# Patient Record
Sex: Female | Born: 1985 | Race: White | Hispanic: No | Marital: Married | Smoking: Never smoker
Health system: Southern US, Community
[De-identification: ages and names within clinical notes are randomized; demographics above are authoritative.]

---

## 2014-12-01 ENCOUNTER — Emergency Department: Payer: Self-pay | Admitting: Emergency Medicine

## 2016-09-15 IMAGING — US US OB LIMITED
1 series · 14 of 28 positions shown · non-contrast
Comparison: none

CLINICAL DATA: Vaginal bleeding.  Pelvic pain.  Unsure of LMP.

EXAM:
LIMITED OBSTETRIC ULTRASOUND

[Series 1: us ob limited · 0.22mm/px · 14 of 66 slices shown]
[im 3/66]
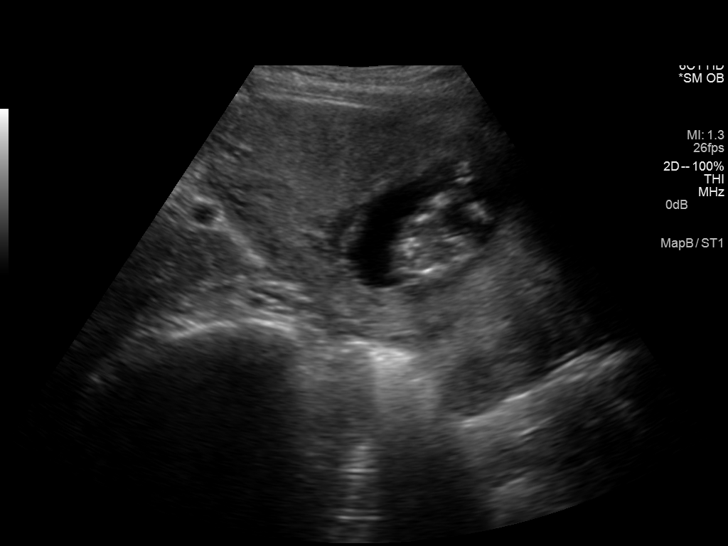
[im 8/66]
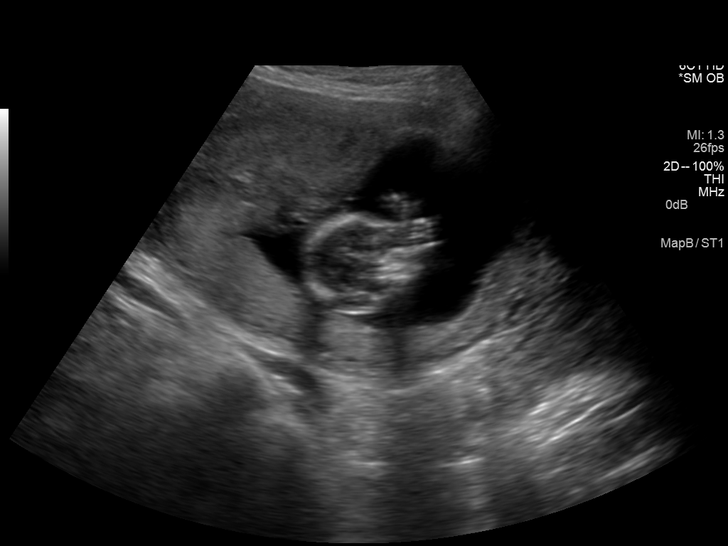
[im 13/66]
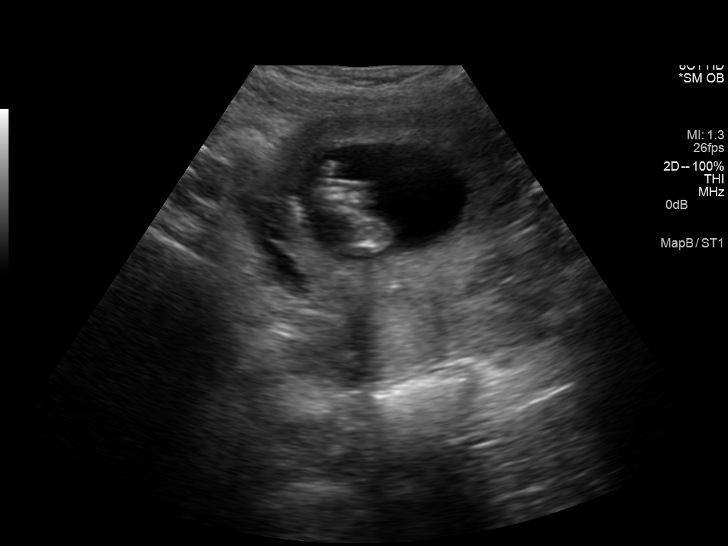
[im 17/66]
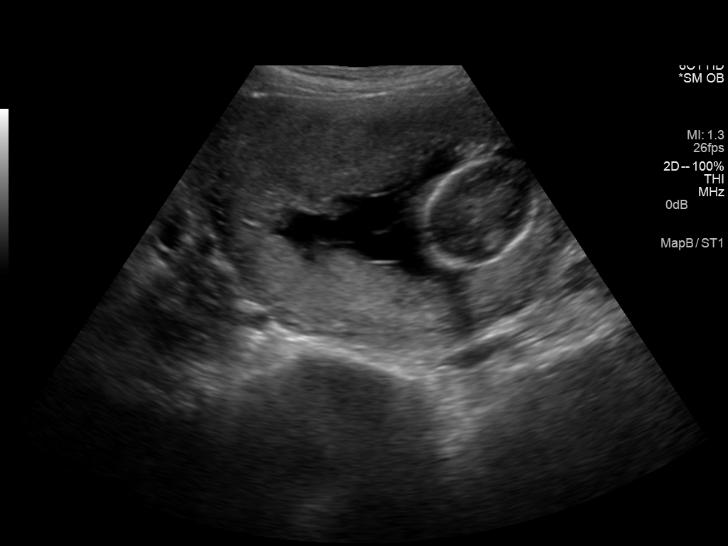
[im 22/66]
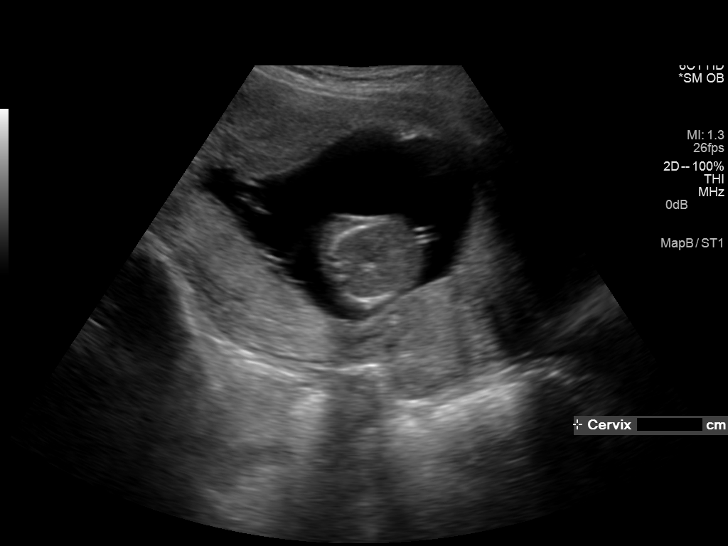
[im 27/66]
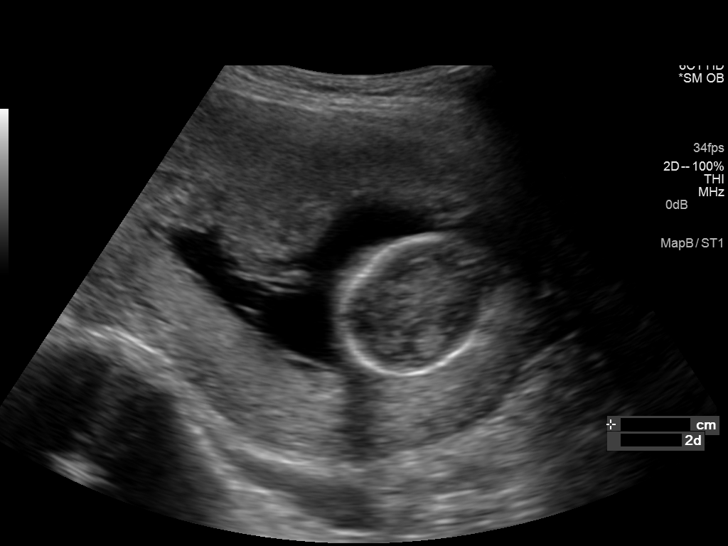
[im 32/66]
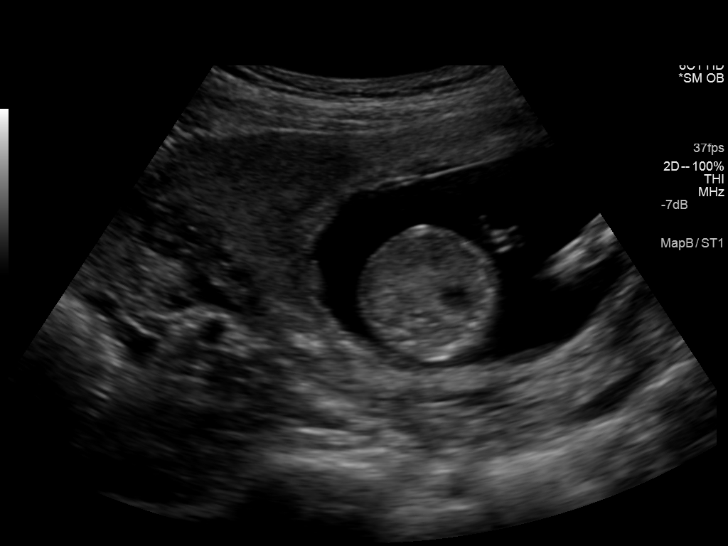
[im 37/66]
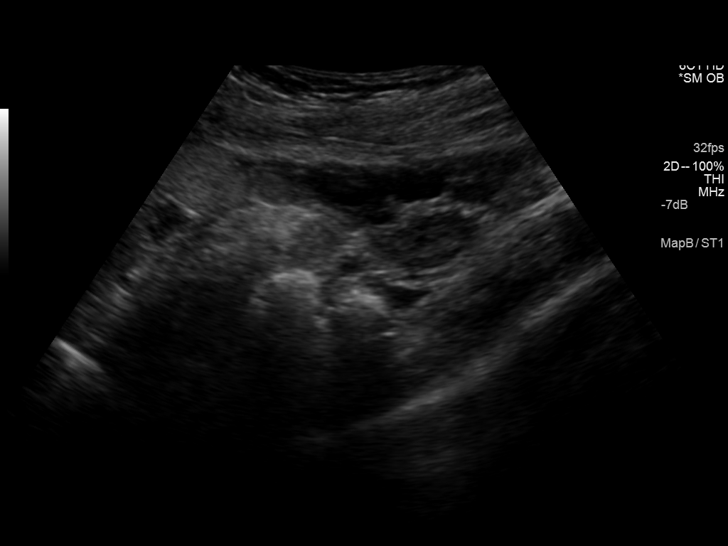
[im 41/66]
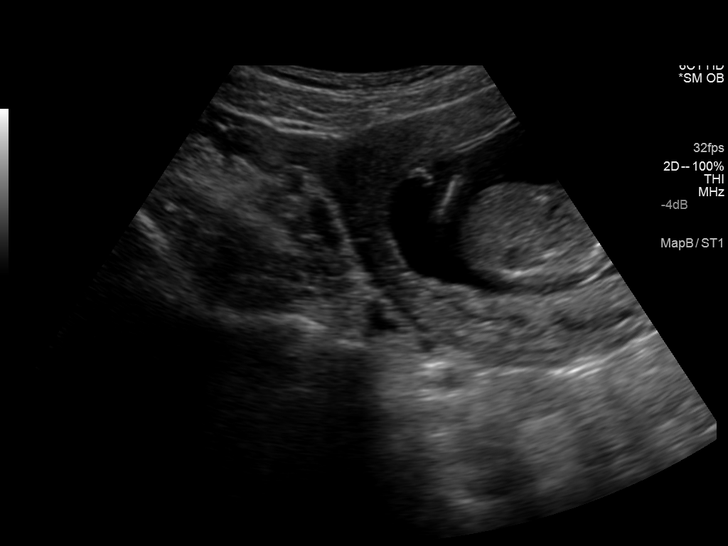
[im 46/66]
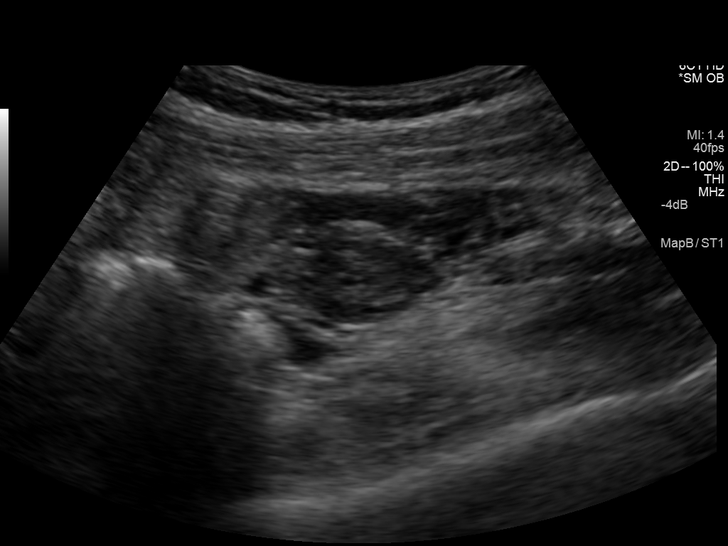
[im 51/66]
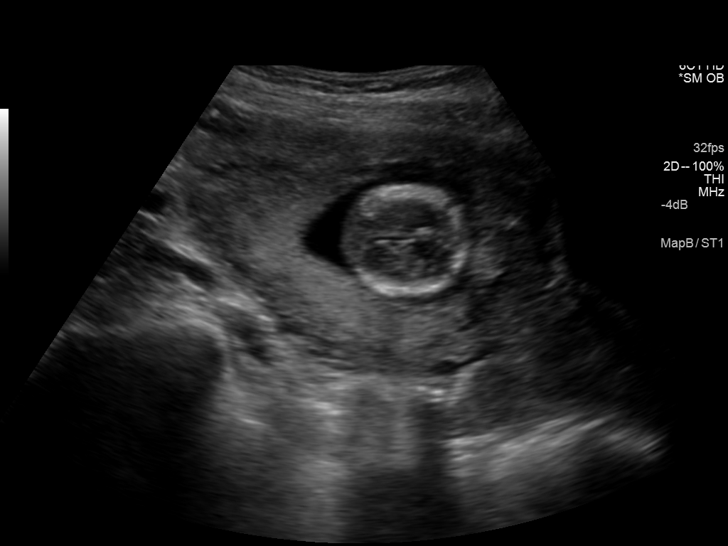
[im 56/66]
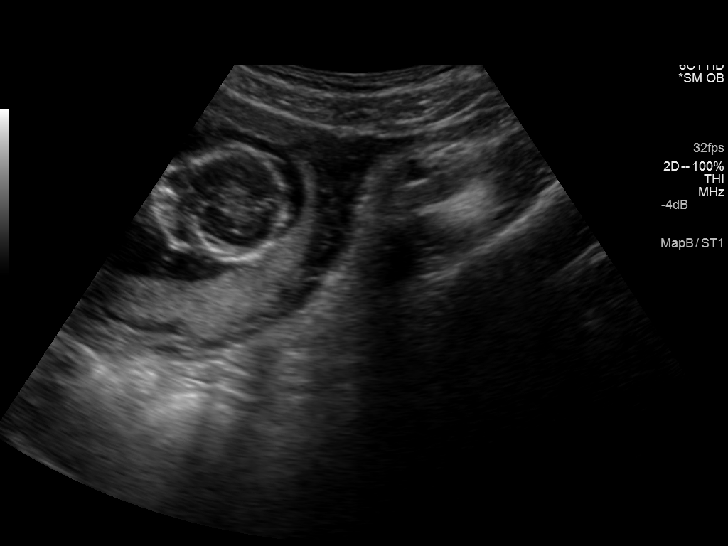
[im 61/66]
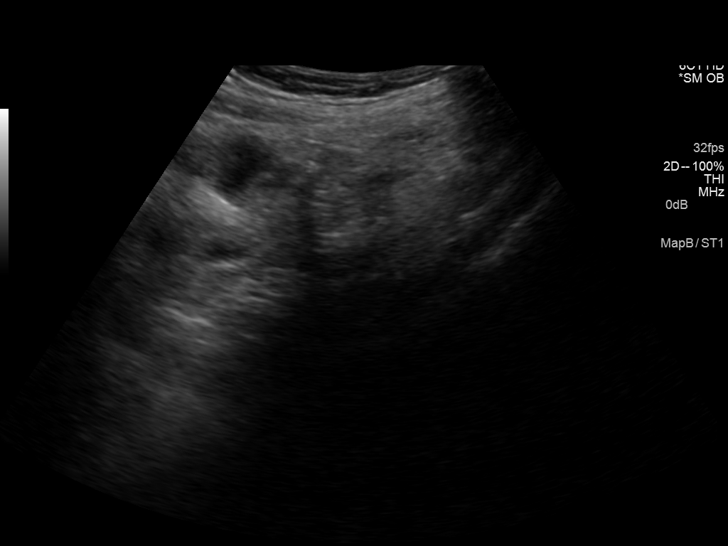
[im 66/66]
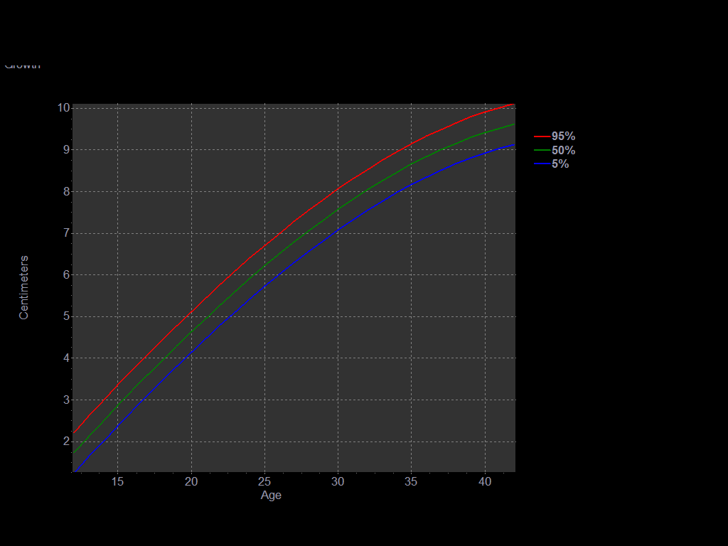

[14 of 28 positions shown; findings below may reference images not displayed]

FINDINGS: Number of Fetuses: 1

Heart Rate:  147 bpm

Movement: Yes

Presentation: Variable

Placental Location: Fundal posterior

Previa: No

Amniotic Fluid (Subjective):  Within normal limits.

BPD:  2.9cm 15w  2d

MATERNAL FINDINGS:

Cervix:  Appears closed.  Measures 3.4 cm in length.

Uterus/Adnexae:  No abnormality visualized.
IMPRESSION: Single living IUP.  No acute maternal findings visualized.

This exam is performed on an emergent basis and does not
comprehensively evaluate fetal size, dating, or anatomy; follow-up
complete OB US should be considered if further fetal assessment is
warranted.

## 2020-01-20 ENCOUNTER — Emergency Department: Payer: Self-pay

## 2020-01-20 ENCOUNTER — Emergency Department
Admission: EM | Admit: 2020-01-20 | Discharge: 2020-01-20 | Disposition: A | Discharge status: Home / Self Care | Payer: Self-pay | Attending: Emergency Medicine | Admitting: Emergency Medicine

## 2020-01-20 ENCOUNTER — Other Ambulatory Visit: Payer: Self-pay

## 2020-01-20 DIAGNOSIS — Y999 Unspecified external cause status: Secondary | ICD-10-CM | POA: Insufficient documentation

## 2020-01-20 DIAGNOSIS — Y9389 Activity, other specified: Secondary | ICD-10-CM | POA: Insufficient documentation

## 2020-01-20 DIAGNOSIS — Y929 Unspecified place or not applicable: Secondary | ICD-10-CM | POA: Insufficient documentation

## 2020-01-20 DIAGNOSIS — S60222A Contusion of left hand, initial encounter: Secondary | ICD-10-CM

## 2020-01-20 DIAGNOSIS — S20229A Contusion of unspecified back wall of thorax, initial encounter: Secondary | ICD-10-CM | POA: Insufficient documentation

## 2020-01-20 DIAGNOSIS — T7491XA Unspecified adult maltreatment, confirmed, initial encounter: Secondary | ICD-10-CM

## 2020-01-20 DIAGNOSIS — S81811A Laceration without foreign body, right lower leg, initial encounter: Secondary | ICD-10-CM

## 2020-01-20 MED ORDER — IBUPROFEN 600 MG PO TABS
600.0000 mg | ORAL_TABLET | Freq: Once | ORAL | Status: AC
  Administered 2020-01-20: 600 mg via ORAL
  Filled 2020-01-20: qty 1

## 2020-01-20 NOTE — ED Notes (Signed)
No peripheral IV placed this visit.   Discharge instructions reviewed with patient. Questions fielded by this RN. Patient verbalizes understanding of instructions. Patient discharged home in stable condition per veronese. No acute distress noted at time of discharge.    
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
## 2020-01-20 NOTE — ED Triage Notes (Signed)
Patient reports left hand pain after fall.
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
## 2020-01-20 NOTE — Discharge Instructions (Addendum)
ACE wrap as needed for comfort. Ice and ibuprofen for pain and swelling.  Keep laceration on your leg clean and dry. Return to the ER or go see your doctor if you noticed redness of the skin, pus, or fever.

## 2021-04-07 ENCOUNTER — Emergency Department
Admission: EM | Admit: 2021-04-07 | Discharge: 2021-04-07 | Disposition: A | Discharge status: Home / Self Care | Payer: BC Managed Care – PPO | Attending: Emergency Medicine | Admitting: Emergency Medicine

## 2021-04-07 ENCOUNTER — Other Ambulatory Visit: Payer: Self-pay

## 2021-04-07 ENCOUNTER — Encounter: Payer: Self-pay | Admitting: Emergency Medicine

## 2021-04-07 DIAGNOSIS — F332 Major depressive disorder, recurrent severe without psychotic features: Secondary | ICD-10-CM | POA: Insufficient documentation

## 2021-04-07 DIAGNOSIS — Z7289 Other problems related to lifestyle: Secondary | ICD-10-CM

## 2021-04-07 DIAGNOSIS — F10929 Alcohol use, unspecified with intoxication, unspecified: Secondary | ICD-10-CM

## 2021-04-07 DIAGNOSIS — F10129 Alcohol abuse with intoxication, unspecified: Secondary | ICD-10-CM | POA: Diagnosis not present

## 2021-04-07 DIAGNOSIS — R4588 Nonsuicidal self-harm: Secondary | ICD-10-CM | POA: Diagnosis not present

## 2021-04-07 DIAGNOSIS — Y906 Blood alcohol level of 120-199 mg/100 ml: Secondary | ICD-10-CM | POA: Insufficient documentation

## 2021-04-07 DIAGNOSIS — Z046 Encounter for general psychiatric examination, requested by authority: Secondary | ICD-10-CM | POA: Insufficient documentation

## 2021-04-07 DIAGNOSIS — F319 Bipolar disorder, unspecified: Secondary | ICD-10-CM

## 2021-04-07 LAB — COMPREHENSIVE METABOLIC PANEL
ALT: 13 U/L (ref 0–44)
AST: 21 U/L (ref 15–41)
Albumin: 5 g/dL (ref 3.5–5.0)
Alkaline Phosphatase: 35 U/L — ABNORMAL LOW (ref 38–126)
Anion gap: 11 (ref 5–15)
BUN: 9 mg/dL (ref 6–20)
CO2: 19 mmol/L — ABNORMAL LOW (ref 22–32)
Calcium: 9.2 mg/dL (ref 8.9–10.3)
Chloride: 109 mmol/L (ref 98–111)
Creatinine, Ser: 0.74 mg/dL (ref 0.44–1.00)
GFR, Estimated: >60 mL/min (ref >60)
Glucose, Bld: 108 mg/dL — ABNORMAL HIGH (ref 70–99)
Potassium: 3.6 mmol/L (ref 3.5–5.1)
Sodium: 139 mmol/L (ref 135–145)
Total Bilirubin: 0.8 mg/dL (ref 0.3–1.2)
Total Protein: 7.9 g/dL (ref 6.5–8.1)

## 2021-04-07 LAB — SALICYLATE LEVEL: Salicylate Lvl: <7 mg/dL — ABNORMAL LOW (ref 7.0–30.0)

## 2021-04-07 LAB — CBC
HCT: 41.2 % (ref 36.0–46.0)
Hemoglobin: 14.6 g/dL (ref 12.0–15.0)
MCH: 30.6 pg (ref 26.0–34.0)
MCHC: 35.4 g/dL (ref 30.0–36.0)
MCV: 86.4 fL (ref 80.0–100.0)
Platelets: 296 10*3/uL (ref 150–400)
RBC: 4.77 MIL/uL (ref 3.87–5.11)
RDW: 12.4 % (ref 11.5–15.5)
WBC: 10 10*3/uL (ref 4.0–10.5)
nRBC: 0 % (ref 0.0–0.2)

## 2021-04-07 LAB — URINE DRUG SCREEN, QUALITATIVE (ARMC ONLY)
Amphetamines, Ur Screen: NOT DETECTED
Barbiturates, Ur Screen: NOT DETECTED
Benzodiazepine, Ur Scrn: NOT DETECTED
Cannabinoid 50 Ng, Ur ~~LOC~~: POSITIVE — AB
Cocaine Metabolite,Ur ~~LOC~~: NOT DETECTED
MDMA (Ecstasy)Ur Screen: NOT DETECTED
Methadone Scn, Ur: NOT DETECTED
Opiate, Ur Screen: NOT DETECTED
Phencyclidine (PCP) Ur S: NOT DETECTED
Tricyclic, Ur Screen: NOT DETECTED

## 2021-04-07 LAB — POC URINE PREG, ED: Preg Test, Ur: NEGATIVE

## 2021-04-07 LAB — ACETAMINOPHEN LEVEL: Acetaminophen (Tylenol), Serum: <10 ug/mL — ABNORMAL LOW (ref 10–30)

## 2021-04-07 LAB — ETHANOL: Alcohol, Ethyl (B): 169 mg/dL — ABNORMAL HIGH (ref <10)
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
## 2021-04-07 NOTE — ED Notes (Signed)
Pt provided with her 1 belongings bag to get dressed for discharge. Discharge paperwork provided and follow care discussed with pt. Pt provides verbal understanding.
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
## 2021-04-07 NOTE — Discharge Instructions (Addendum)
You have been seen in the Emergency Department (ED) today for a psychiatric complaint.  You have been evaluated by the emergency department physician and by psychiatry, and we believe you are safe to be discharged from the hospital.    Please return to the ED immediately if you have ANY thoughts of hurting yourself or anyone else, so that we may help you.  Please avoid alcohol and drug use.  Follow up with your doctor and/or therapist as soon as possible regarding today's ED visit.   Please follow up any other recommendations and clinic appointments provided by the psychiatry team that saw you in the Emergency Department.
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
